# Patient Record
Sex: Female | Born: 1980 | Race: Black or African American | Hispanic: No | Marital: Married | Smoking: Never smoker
Health system: Southern US, Community
[De-identification: ages and names within clinical notes are randomized; demographics above are authoritative.]

## PROBLEM LIST (undated history)

## (undated) DIAGNOSIS — D6859 Other primary thrombophilia: Secondary | ICD-10-CM

## (undated) HISTORY — PX: TUBAL LIGATION: SHX77

---

## 2015-02-14 ENCOUNTER — Ambulatory Visit
Admission: EM | Admit: 2015-02-14 | Discharge: 2015-02-14 | Disposition: A | Discharge status: Home / Self Care | Payer: BC Managed Care – PPO | Attending: Family Medicine | Admitting: Family Medicine

## 2015-02-14 ENCOUNTER — Encounter: Payer: Self-pay | Admitting: *Deleted

## 2015-02-14 DIAGNOSIS — J018 Other acute sinusitis: Secondary | ICD-10-CM | POA: Diagnosis not present

## 2015-02-14 HISTORY — DX: Other primary thrombophilia: D68.59

## 2015-02-14 MED ORDER — FLUTICASONE PROPIONATE 50 MCG/ACT NA SUSP: 1.0000 | Freq: Every day | NASAL | Status: DC

## 2015-02-14 MED ORDER — AZITHROMYCIN 250 MG PO TABS: ORAL_TABLET | ORAL | Status: DC

## 2015-02-14 MED ORDER — LORATADINE 10 MG PO TABS: 10.0000 mg | ORAL_TABLET | Freq: Every day | ORAL | Status: DC

## 2015-02-14 NOTE — ED Notes (Signed)
Patient states that she has had nasal congestion, fever, generlized body aches, and vomiting at night starting this past Friday.  OTC medications have not resolved symptoms.

## 2015-02-14 NOTE — ED Provider Notes (Signed)
Patient presents today with symptoms of nasal congestion, sinus pressure, low-grade fever, myalgias and vomiting due to increased mucus over the last few days. Patient denies any fever today and has not taken any medication. She denies any cough, chest pain, shortness of breath, calf pain, severe headache, abdominal pain, diarrhea.  ROS: Negative except mentioned above.  Vitals as per Epic GENERAL: NAD HEENT: mild pharyngeal erythema, mild maxillary sinus tenderness, no exudate, no erythema of TMs, no cervical LAD RESP: CTA B CARD: RRR NEURO: CN II-XII grossly intact   A/P: Sinusitis- Z-pk, Claritin prn, Flonase prn, Tylenol prn, rest, hydration, seek medical attention if symptoms persist or worsen.   Jolene Provost, MD 02/14/15 [phone]

## 2016-05-07 ENCOUNTER — Encounter (HOSPITAL_COMMUNITY): Payer: Self-pay | Admitting: Emergency Medicine

## 2016-05-07 ENCOUNTER — Ambulatory Visit (HOSPITAL_COMMUNITY)
Admission: EM | Admit: 2016-05-07 | Discharge: 2016-05-07 | Disposition: A | Discharge status: Home / Self Care | Payer: BC Managed Care – PPO | Attending: Internal Medicine | Admitting: Internal Medicine

## 2016-05-07 DIAGNOSIS — J029 Acute pharyngitis, unspecified: Secondary | ICD-10-CM | POA: Diagnosis present

## 2016-05-07 DIAGNOSIS — R69 Illness, unspecified: Secondary | ICD-10-CM

## 2016-05-07 DIAGNOSIS — J111 Influenza due to unidentified influenza virus with other respiratory manifestations: Secondary | ICD-10-CM | POA: Insufficient documentation

## 2016-05-07 LAB — POCT RAPID STREP A: Streptococcus, Group A Screen (Direct): NEGATIVE

## 2016-05-07 MED ORDER — OSELTAMIVIR PHOSPHATE 75 MG PO CAPS: 75.0000 mg | ORAL_CAPSULE | Freq: Two times a day (BID) | ORAL | 0 refills | Status: DC

## 2016-05-07 MED ORDER — ALBUTEROL SULFATE HFA 108 (90 BASE) MCG/ACT IN AERS
2.0000 | INHALATION_SPRAY | RESPIRATORY_TRACT | 0 refills | Status: AC | PRN
Start: 2016-05-07 — End: ?

## 2016-05-07 MED ORDER — PREDNISONE 50 MG PO TABS: 50.0000 mg | ORAL_TABLET | Freq: Every day | ORAL | 0 refills | Status: DC

## 2016-05-07 NOTE — ED Provider Notes (Signed)
[facility]    CSN: [id number] Arrival date & time: 05/07/16  1801     History   Chief Complaint Chief Complaint  Patient presents with  . Cough  . Sore Throat    HPI Adriana Owen is a 36 y.o. female. Patient presents with sore throat and cough that started today. No fever that she knows of. Teaches first grade. Has had some students report headaches, and one with a GI concern recently. Thinks she had a flu shot last fall. Her students are in the middle of testing right now.   HPI  Past Medical History:  Diagnosis Date  . 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 CESAREAN SECTION     2x  . TUBAL LIGATION       Home Medications   Takes no meds regularly  Family History History reviewed. No pertinent family history.  Social History Social History  Substance Use Topics  . Smoking status: Never Smoker  . Smokeless tobacco: Never Used  . Alcohol use No     Allergies   Bee venom   Review of Systems Review of Systems  All other systems reviewed and are negative.    Physical Exam Triage Vital Signs ED Triage Vitals  Enc Vitals Group     BP 05/07/16 1814 128/72     Pulse Rate 05/07/16 1814 80     Resp 05/07/16 1814 18     Temp 05/07/16 1814 98.5 F (36.9 C)     Temp Source 05/07/16 1814 Oral     SpO2 05/07/16 1814 99 %     Weight --      Height --      Pain Score 05/07/16 1813 4     Pain Loc --    Updated Vital Signs BP 128/72 (BP Location: Right Arm)   Pulse 80   Temp 98.5 F (36.9 C) (Oral)   Resp 18   SpO2 99%   Physical Exam  Constitutional: She is oriented to person, place, and time. No distress.  HENT:  Head: Atraumatic.  Bilateral TMs are moderately dull, no erythema Mild nasal congestion bilaterally Throat is slightly injected, with prominent tonsils  Eyes:  Conjugate gaze observed, no eye redness/discharge  Neck: Neck supple.  Cardiovascular: Normal rate and  regular rhythm.   Pulmonary/Chest: No respiratory distress. She has no wheezes. She has no rales.  Slightly coarse but symmetric breath sounds throughout  Abdominal: She exhibits no distension.  Musculoskeletal: Normal range of motion.  Neurological: She is alert and oriented to person, place, and time.  Skin: Skin is warm and dry.  Nursing note and vitals reviewed.    UC Treatments / Results   Procedures Procedures (including critical care time) None today  Final Clinical Impressions(s) / UC Diagnoses   Final diagnoses:  Influenza-like illness   Note for work given, return to work 5/1.  New Prescriptions Discharge Medication List as of 05/07/2016  8:22 PM    START taking these medications   Details  albuterol (PROVENTIL HFA;VENTOLIN HFA) 108 (90 Base) MCG/ACT inhaler Inhale 2 puffs into the lungs every 4 (four) hours as needed for wheezing or shortness of breath., Starting Sun 05/07/2016, Normal    oseltamivir (TAMIFLU) 75 MG capsule Take 1 capsule (75 mg total) by mouth every 12 (twelve) hours., Starting Sun 05/07/2016, Normal    predniSONE (DELTASONE) 50 MG tablet Take 1 tablet (50 mg total) by mouth daily., Starting  Wynelle Link 05/07/2016, Normal         Eustace Moore, MD 05/10/16 0730

## 2016-05-07 NOTE — ED Triage Notes (Signed)
The patient presented to the [facility] with a complaint of a cough and sore throat that started today. The patient denied any fever at home.

## 2016-05-10 LAB — CULTURE, GROUP A STREP (THRC)

## 2016-11-14 ENCOUNTER — Other Ambulatory Visit: Payer: Self-pay | Admitting: Family Medicine

## 2017-08-05 ENCOUNTER — Emergency Department: Payer: BC Managed Care – PPO

## 2017-08-05 ENCOUNTER — Other Ambulatory Visit: Payer: Self-pay

## 2017-08-05 ENCOUNTER — Observation Stay
Admission: EM | Admit: 2017-08-05 | Discharge: 2017-08-06 | Disposition: A | Discharge status: Home / Self Care | Payer: BC Managed Care – PPO | Attending: Specialist | Admitting: Specialist

## 2017-08-05 DIAGNOSIS — R0789 Other chest pain: Secondary | ICD-10-CM | POA: Diagnosis present

## 2017-08-05 DIAGNOSIS — J90 Pleural effusion, not elsewhere classified: Secondary | ICD-10-CM | POA: Diagnosis not present

## 2017-08-05 DIAGNOSIS — D6859 Other primary thrombophilia: Secondary | ICD-10-CM | POA: Insufficient documentation

## 2017-08-05 DIAGNOSIS — R079 Chest pain, unspecified: Secondary | ICD-10-CM | POA: Diagnosis present

## 2017-08-05 DIAGNOSIS — Z79899 Other long term (current) drug therapy: Secondary | ICD-10-CM | POA: Insufficient documentation

## 2017-08-05 LAB — CBC
HEMATOCRIT: 43.6 % (ref 35.0–47.0)
HEMOGLOBIN: 14.9 g/dL (ref 12.0–16.0)
MCH: 31.3 pg (ref 26.0–34.0)
MCHC: 34.3 g/dL (ref 32.0–36.0)
MCV: 91.3 fL (ref 80.0–100.0)
Platelets: 264 10*3/uL (ref 150–440)
RBC: 4.78 MIL/uL (ref 3.80–5.20)
RDW: 13.9 % (ref 11.5–14.5)
WBC: 10.5 10*3/uL (ref 3.6–11.0)

## 2017-08-05 LAB — BASIC METABOLIC PANEL
Anion gap: 5 (ref 5–15)
BUN: 15 mg/dL (ref 6–20)
CALCIUM: 9 mg/dL (ref 8.9–10.3)
CHLORIDE: 108 mmol/L (ref 98–111)
CO2: 26 mmol/L (ref 22–32)
Creatinine, Ser: 0.9 mg/dL (ref 0.44–1.00)
GFR calc non Af Amer: >60 mL/min (ref >60)
Glucose, Bld: 108 mg/dL — ABNORMAL HIGH (ref 70–99)
POTASSIUM: 4.3 mmol/L (ref 3.5–5.1)
Sodium: 139 mmol/L (ref 135–145)

## 2017-08-05 LAB — TROPONIN I
Troponin I: <0.03 ng/mL (ref <0.03)
Troponin I: <0.03 ng/mL (ref <0.03)
Troponin I: <0.03 ng/mL (ref <0.03)
Troponin I: <0.03 ng/mL (ref <0.03)

## 2017-08-05 LAB — HEMOGLOBIN A1C
Hgb A1c MFr Bld: 5.5 % (ref 4.8–5.6)
Mean Plasma Glucose: 111.15 mg/dL

## 2017-08-05 LAB — TSH: TSH: 3.056 u[IU]/mL (ref 0.350–4.500)

## 2017-08-05 MED ORDER — MORPHINE SULFATE (PF) 2 MG/ML IV SOLN
2.0000 mg | Freq: Once | INTRAVENOUS | Status: AC
  Administered 2017-08-05: 2 mg via INTRAVENOUS

## 2017-08-05 MED ORDER — ASPIRIN EC 81 MG PO TBEC
81.0000 mg | DELAYED_RELEASE_TABLET | Freq: Every day | ORAL | Status: DC
  Administered 2017-08-05 – 2017-08-06 (×2): 81 mg via ORAL
  Filled 2017-08-05 (×2): qty 1

## 2017-08-05 MED ORDER — ONDANSETRON HCL 4 MG/2ML IJ SOLN: 4.0000 mg | Freq: Four times a day (QID) | INTRAMUSCULAR | Status: DC | PRN

## 2017-08-05 MED ORDER — TRAMADOL HCL 50 MG PO TABS: 50.0000 mg | ORAL_TABLET | Freq: Four times a day (QID) | ORAL | 0 refills | Status: AC | PRN

## 2017-08-05 MED ORDER — ACETAMINOPHEN 650 MG RE SUPP: 650.0000 mg | Freq: Four times a day (QID) | RECTAL | Status: DC | PRN

## 2017-08-05 MED ORDER — MORPHINE SULFATE (PF) 2 MG/ML IV SOLN
INTRAVENOUS | Status: AC
  Administered 2017-08-05: 2 mg via INTRAVENOUS
  Filled 2017-08-05: qty 1

## 2017-08-05 MED ORDER — ONDANSETRON HCL 4 MG/2ML IJ SOLN
4.0000 mg | Freq: Once | INTRAMUSCULAR | Status: AC
  Administered 2017-08-05: 4 mg via INTRAVENOUS

## 2017-08-05 MED ORDER — FONDAPARINUX SODIUM 2.5 MG/0.5ML ~~LOC~~ SOLN
2.5000 mg | Freq: Every day | SUBCUTANEOUS | Status: DC
  Administered 2017-08-05 – 2017-08-06 (×2): 2.5 mg via SUBCUTANEOUS
  Filled 2017-08-05 (×2): qty 0.5

## 2017-08-05 MED ORDER — DOCUSATE SODIUM 100 MG PO CAPS
100.0000 mg | ORAL_CAPSULE | Freq: Two times a day (BID) | ORAL | Status: DC
  Administered 2017-08-05 – 2017-08-06 (×2): 100 mg via ORAL
  Filled 2017-08-05 (×3): qty 1

## 2017-08-05 MED ORDER — ENOXAPARIN SODIUM 40 MG/0.4ML ~~LOC~~ SOLN: 40.0000 mg | SUBCUTANEOUS | Status: DC

## 2017-08-05 MED ORDER — SODIUM CHLORIDE 0.9 % IV BOLUS
1000.0000 mL | Freq: Once | INTRAVENOUS | Status: AC
  Administered 2017-08-05: 1000 mL via INTRAVENOUS

## 2017-08-05 MED ORDER — MORPHINE SULFATE (PF) 2 MG/ML IV SOLN
INTRAVENOUS | Status: AC
  Filled 2017-08-05: qty 1

## 2017-08-05 MED ORDER — MORPHINE SULFATE (PF) 4 MG/ML IV SOLN: 4.0000 mg | Freq: Once | INTRAVENOUS | Status: DC

## 2017-08-05 MED ORDER — ASPIRIN 81 MG PO CHEW
CHEWABLE_TABLET | ORAL | Status: AC
  Filled 2017-08-05: qty 4

## 2017-08-05 MED ORDER — KETOROLAC TROMETHAMINE 30 MG/ML IJ SOLN
30.0000 mg | Freq: Four times a day (QID) | INTRAMUSCULAR | Status: DC | PRN
  Administered 2017-08-05 – 2017-08-06 (×3): 30 mg via INTRAVENOUS
  Filled 2017-08-05 (×3): qty 1

## 2017-08-05 MED ORDER — ACETAMINOPHEN 325 MG PO TABS: 650.0000 mg | ORAL_TABLET | Freq: Four times a day (QID) | ORAL | Status: DC | PRN

## 2017-08-05 MED ORDER — ONDANSETRON HCL 4 MG/2ML IJ SOLN
INTRAMUSCULAR | Status: AC
  Administered 2017-08-05: 4 mg via INTRAVENOUS
  Filled 2017-08-05: qty 2

## 2017-08-05 MED ORDER — ASPIRIN 81 MG PO CHEW
324.0000 mg | CHEWABLE_TABLET | Freq: Once | ORAL | Status: AC
  Administered 2017-08-05: 324 mg via ORAL

## 2017-08-05 MED ORDER — IOPAMIDOL (ISOVUE-370) INJECTION 76%
75.0000 mL | Freq: Once | INTRAVENOUS | Status: AC | PRN
  Administered 2017-08-05: 75 mL via INTRAVENOUS

## 2017-08-05 MED ORDER — ONDANSETRON HCL 4 MG PO TABS: 4.0000 mg | ORAL_TABLET | Freq: Four times a day (QID) | ORAL | Status: DC | PRN

## 2017-08-05 NOTE — ED Notes (Signed)
Admitting MD at bedside.

## 2017-08-05 NOTE — ED Notes (Signed)
ED Provider at bedside. 

## 2017-08-05 NOTE — H&P (Signed)
Adriana Owen is an 37 y.o. female.   Chief Complaint: Chest pain HPI: The patient with past medical history of Antithrombin III deficiency presents to emergency department complaining of chest pain.  The pain began approximately 14 hours prior to admission.  It is behind her left breast and does not radiate.  The patient admits that the pain is positional and hurts worse when she is lying down.  She could also elicit the pain by breathing deeply which prompted the emergency department staff to obtain a CTA of the chest to rule out pulmonary embolus.  CT scan was negative.  The patient's troponin was negative and her EKG was negative for signs of ischemia.  However due to her coagulation disorder the emergency department staff requested for rule out of acute coronary syndrome.  Past Medical History:  Diagnosis Date  . 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 CESAREAN SECTION     2x  . TUBAL LIGATION      Family History  Problem Relation Age of Onset  . Antithrombin III deficiency Mother    Social History:  reports that she has never smoked. She has never used smokeless tobacco. She reports that she does not drink alcohol or use drugs.  Allergies:  Allergies  Allergen Reactions  . Bee Venom Swelling  . Pork-Derived Products Other (See Comments)    Religious Reasons     (Not in a hospital admission)  Results for orders placed or performed during the hospital encounter of 08/05/17 (from the past 48 hour(s))  Basic metabolic panel     Status: Abnormal   Collection Time: 08/05/17  4:07 AM  Result Value Ref Range   Sodium 139 135 - 145 mmol/L   Potassium 4.3 3.5 - 5.1 mmol/L   Chloride 108 98 - 111 mmol/L   CO2 26 22 - 32 mmol/L   Glucose, Bld 108 (H) 70 - 99 mg/dL   BUN 15 6 - 20 mg/dL   Creatinine, Ser 0.90 0.44 - 1.00 mg/dL   Calcium 9.0 8.9 - 10.3 mg/dL   GFR calc non Af Amer >60 >60 mL/min   GFR calc Af Amer >60 >60  mL/min    Comment: (NOTE) The eGFR has been calculated using the CKD EPI equation. This calculation has not been validated in all clinical situations. eGFR's persistently <60 mL/min signify possible Chronic Kidney Disease.    Anion gap 5 5 - 15    Comment: Performed at [facility], [address] [place], [place] [address]  CBC     Status: None   Collection Time: 08/05/17  4:07 AM  Result Value Ref Range   WBC 10.5 3.6 - 11.0 K/uL   RBC 4.78 3.80 - 5.20 MIL/uL   Hemoglobin 14.9 12.0 - 16.0 g/dL   HCT 43.6 35.0 - 47.0 %   MCV 91.3 80.0 - 100.0 fL   MCH 31.3 26.0 - 34.0 pg   MCHC 34.3 32.0 - 36.0 g/dL   RDW 13.9 11.5 - 14.5 %   Platelets 264 150 - 440 K/uL    Comment: Performed at [facility], [address] [place], [place] [address]  Troponin I     Status: None   Collection Time: 08/05/17  4:07 AM  Result Value Ref Range   Troponin I <0.03 <0.03 ng/mL    Comment: Performed at [facility], [address] [place], [place] [address]   Ct Angio  Chest Pe W And/or Wo Contrast  Result Date: 08/05/2017 CLINICAL DATA:  LEFT chest pain beginning at 1300 hours yesterday, gradually worsening. Shortness of breath. Pain with inspiration. History of anti thrombin 3 deficiency. EXAM: CT ANGIOGRAPHY CHEST WITH CONTRAST TECHNIQUE: Multidetector CT imaging of the chest was performed using the standard protocol during bolus administration of intravenous contrast. Multiplanar CT image reconstructions and MIPs were obtained to evaluate the vascular anatomy. CONTRAST:  38m ISOVUE-370 IOPAMIDOL (ISOVUE-370) INJECTION 76% COMPARISON:  Chest radiograph August 05, 2017 FINDINGS: CARDIOVASCULAR: Adequate contrast opacification of the pulmonary artery's. Main pulmonary artery is not enlarged. No pulmonary arterial filling defects to the level of the subsegmental branches. Heart size is normal, no right heart strain. No pericardial effusion. Thoracic aorta is normal course and  caliber, unremarkable. MEDIASTINUM/NODES: No lymphadenopathy by CT size criteria. LUNGS/PLEURA: Tracheobronchial tree is patent, no pneumothorax. Small LEFT pleural effusion with enhancing atelectasis. No focal consolidation. UPPER ABDOMEN: Included view of the abdomen is unremarkable. MUSCULOSKELETAL: Visualized soft tissues and included osseous structures appear normal. Review of the MIP images confirms the above findings. IMPRESSION: 1. No acute pulmonary embolism. 2. Small LEFT pleural effusion and LEFT lower lobe atelectasis. Electronically Signed   By: CElon AlasM.D.   On: 08/05/2017 05:17   Dg Chest Port 1 View  Result Date: 08/05/2017 CLINICAL DATA:  Worsening chest pain today.  Shortness of breath. EXAM: PORTABLE CHEST 1 VIEW COMPARISON:  None. FINDINGS: Cardiomediastinal silhouette is normal. Bronchitic changes with LEFT lung base strandy densities. No pleural effusions or focal consolidations. Trachea projects midline and there is no pneumothorax. Soft tissue planes and included osseous structures are non-suspicious. IMPRESSION: Bronchitic changes.  LEFT lung base atelectasis. Electronically Signed   By: CElon AlasM.D.   On: 08/05/2017 04:14    Review of Systems  Constitutional: Negative for chills and fever.  HENT: Negative for sore throat and tinnitus.   Eyes: Negative for blurred vision and redness.  Respiratory: Negative for cough and shortness of breath.   Cardiovascular: Positive for chest pain. Negative for palpitations, orthopnea and PND.  Gastrointestinal: Negative for abdominal pain, diarrhea, nausea and vomiting.  Genitourinary: Negative for dysuria, frequency and urgency.  Musculoskeletal: Negative for joint pain and myalgias.  Skin: Negative for rash.       No lesions  Neurological: Negative for speech change, focal weakness and weakness.  Endo/Heme/Allergies: Does not bruise/bleed easily.       No temperature intolerance  Psychiatric/Behavioral: Negative  for depression and suicidal ideas.    Blood pressure 113/73, pulse (!) 50, resp. rate 11, height 5' 5.5" (1.664 m), weight 77.1 kg (170 lb), last menstrual period 07/17/2017, SpO2 100 %. Physical Exam  Vitals reviewed. Constitutional: She is oriented to person, place, and time. She appears well-developed and well-nourished. No distress.  HENT:  Head: Normocephalic and atraumatic.  Mouth/Throat: Oropharynx is clear and moist.  Eyes: Pupils are equal, round, and reactive to light. Conjunctivae and EOM are normal. No scleral icterus.  Neck: Normal range of motion. Neck supple. No JVD present. No tracheal deviation present. No thyromegaly present.  Cardiovascular: Normal rate, regular rhythm and normal heart sounds. Exam reveals no gallop and no friction rub.  No murmur heard. Respiratory: Effort normal and breath sounds normal.  GI: Soft. Bowel sounds are normal. She exhibits no distension and no mass. There is tenderness. There is no rebound and no guarding.  Genitourinary:  Genitourinary Comments: Deferred  Musculoskeletal: Normal range of motion. She exhibits no edema.  Lymphadenopathy:    She has no cervical adenopathy.  Neurological: She is alert and oriented to person, place, and time. No cranial nerve deficit. She exhibits normal muscle tone.  Skin: Skin is warm and dry. No rash noted. No erythema.  Psychiatric: She has a normal mood and affect. Her behavior is normal. Judgment and thought content normal.     Assessment/Plan This is a 37 year old female admitted for chest pain.  1.  Chest pain: Atypical; reproducible with palpation of the patient's left upper quadrant.  Further examination of her chest x-ray shows significant gas in the splenic flexure.  Etiology of the patient's atypical chest pain is likely intestinal gas.  Nonetheless, consult cardiology as the patient does not take aspirin every day as she should.  Continue to monitor telemetry and follow cardiac biomarkers. 2.   Antithrombin III deficiency: I restarted aspirin. 3.  DVT prophylaxis: [place] 4.  GI prophylaxis: None The patient is a full code.  Time spent on admission orders and patient care proximally 45 minutes  Harrie Foreman, MD 08/05/2017, 6:30 AM

## 2017-08-05 NOTE — Progress Notes (Signed)
Cardiology Consultation:   Patient [facility]; [id number]; 1980/08/19   Admit date: 08/05/2017 Date of Consult: 08/05/2017  Primary Care Provider: [facility] Primary Cardiologist: No primary care provider on file. (New)  Patient Profile:   Adriana Owen is a 37 y.o. female with a hx of anti-thrombin 3 deficieincy who is being seen today for the evaluation of chest pain at the request of Dr. Cherlynn Owen.  History of Present Illness:   [name] is a 37 yo female with history of anti-thrombin 3 deficiency who is admitted with chest pain. No prior cardiac history. The pain is in under her left breast and does not radiate. No associated dyspnea, nausea, vomiting or diaphoresis. EKG without ischemic changes. Troponin negative. The pain worsens with movement and deep breaths.   Past Medical History:  Diagnosis Date  . 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 CESAREAN SECTION     2x  . TUBAL LIGATION       Home Medications:  Prior to Admission medications   Medication Sig Start Date End Date Taking? Authorizing Provider  albuterol (PROVENTIL HFA;VENTOLIN HFA) 108 (90 Base) MCG/ACT inhaler Inhale 2 puffs into the lungs every 4 (four) hours as needed for wheezing or shortness of breath. 05/07/16   Adriana Rankin, MD  EPINEPHrine 0.3 mg/0.3 mL IJ SOAJ injection Inject 0.3 mg into the muscle once as needed for anaphylaxis.    [provider]    Inpatient Medications: Scheduled Meds: . aspirin      . aspirin EC  81 mg Oral Daily  . docusate sodium  100 mg Oral BID  . fondaparinux (ARIXTRA) injection  2.5 mg Subcutaneous Daily  .  morphine injection  4 mg Intravenous Once   Continuous Infusions:  PRN Meds: acetaminophen **OR** acetaminophen, ketorolac, ondansetron **OR** ondansetron (ZOFRAN) IV  Allergies:    Allergies  Allergen Reactions  . Bee Venom Swelling  . Pork-Derived  Products Other (See Comments)    Religious Reasons    Social History:   Social History   Socioeconomic History  . Marital status: Married    Spouse name: Not on file  . Number of children: Not on file  . Years of education: Not on file  . Highest education level: Not on file  Occupational History  . Not on file  Social Needs  . Financial resource strain: Not on file  . Food insecurity:    Worry: Not on file    Inability: Not on file  . Transportation needs:    Medical: Not on file    Non-medical: Not on file  Tobacco Use  . Smoking status: Never Smoker  . Smokeless tobacco: Never Used  Substance and Sexual Activity  . Alcohol use: No  . Drug use: No  . Sexual activity: Not on file  Lifestyle  . Physical activity:    Days per week: Not on file    Minutes per session: Not on file  . Stress: Not on file  Relationships  . Social connections:    Talks on phone: Not on file    Gets together: Not on file    Attends religious service: Not on file    Active member of club or organization: Not on file    Attends meetings of clubs or organizations: Not on file    Relationship status: Not on file  . Intimate partner violence:    Fear of current or ex  partner: Not on file    Emotionally abused: Not on file    Physically abused: Not on file    Forced sexual activity: Not on file  Other Topics Concern  . Not on file  Social History Narrative  . Not on file    Family History:   Family History  Problem Relation Age of Onset  . Antithrombin III deficiency Mother      ROS:  Please see the history of present illness.  All other ROS reviewed and negative.     Physical Exam/Data:   Vitals:   08/05/17 0700 08/05/17 0730 08/05/17 0800 08/05/17 0822  BP: 110/71 110/64 112/63 105/68  Pulse: 68 (!) 59 70 62  Resp: 11 (!) 25 19   Temp:    97.9 F (36.6 C)  TempSrc:    Oral  SpO2: 97% 97% 97% 100%  Weight:      Height:        Intake/Output Summary (Last 24 hours) at  08/05/2017 1222 Last data filed at 08/05/2017 1035 Gross per 24 hour  Intake 1120 ml  Output -  Net 1120 ml   Filed Weights   08/05/17 0348  Weight: 170 lb (77.1 kg)   Body mass index is 27.86 kg/m.  General:  Well nourished, well developed, in no acute distress HEENT: normal Lymph: no adenopathy Neck: no JVD Endocrine:  No thryomegaly Vascular: No carotid bruits; FA pulses 2+ bilaterally without bruits  Cardiac:  normal S1, S2; RRR; no murmur  Lungs:  clear to auscultation bilaterally, no wheezing, rhonchi or rales  Abd: soft, nontender, no hepatomegaly  Ext: no edema Musculoskeletal:  No deformities, BUE and BLE strength normal and equal Skin: warm and dry  Neuro:  CNs 2-12 intact, no focal abnormalities noted Psych:  Normal affect   EKG:  The EKG was personally reviewed and demonstrates:  Sinus, repolarization abnormality.  Telemetry:  Telemetry was personally reviewed and demonstrates:  sinus  Relevant CV Studies:   Laboratory Data:  Chemistry Recent Labs  Lab 08/05/17 0407  NA 139  K 4.3  CL 108  CO2 26  GLUCOSE 108*  BUN 15  CREATININE 0.90  CALCIUM 9.0  GFRNONAA >60  GFRAA >60  ANIONGAP 5    No results for input(s): PROT, ALBUMIN, AST, ALT, ALKPHOS, BILITOT in the last 168 hours. Hematology Recent Labs  Lab 08/05/17 0407  WBC 10.5  RBC 4.78  HGB 14.9  HCT 43.6  MCV 91.3  MCH 31.3  MCHC 34.3  RDW 13.9  PLT 264   Cardiac Enzymes Recent Labs  Lab 08/05/17 0407  TROPONINI <0.03   No results for input(s): TROPIPOC in the last 168 hours.  BNPNo results for input(s): BNP, PROBNP in the last 168 hours.  DDimer No results for input(s): DDIMER in the last 168 hours.  Radiology/Studies:  Ct Angio Chest Pe W And/or Wo Contrast  Result Date: 08/05/2017 CLINICAL DATA:  LEFT chest pain beginning at 1300 hours yesterday, gradually worsening. Shortness of breath. Pain with inspiration. History of anti thrombin 3 deficiency. EXAM: CT ANGIOGRAPHY CHEST  WITH CONTRAST TECHNIQUE: Multidetector CT imaging of the chest was performed using the standard protocol during bolus administration of intravenous contrast. Multiplanar CT image reconstructions and MIPs were obtained to evaluate the vascular anatomy. CONTRAST:  75mL ISOVUE-370 IOPAMIDOL (ISOVUE-370) INJECTION 76% COMPARISON:  Chest radiograph August 05, 2017 FINDINGS: CARDIOVASCULAR: Adequate contrast opacification of the pulmonary artery's. Main pulmonary artery is not enlarged. No pulmonary arterial filling defects to  the level of the subsegmental branches. Heart size is normal, no right heart strain. No pericardial effusion. Thoracic aorta is normal course and caliber, unremarkable. MEDIASTINUM/NODES: No lymphadenopathy by CT size criteria. LUNGS/PLEURA: Tracheobronchial tree is patent, no pneumothorax. Small LEFT pleural effusion with enhancing atelectasis. No focal consolidation. UPPER ABDOMEN: Included view of the abdomen is unremarkable. MUSCULOSKELETAL: Visualized soft tissues and included osseous structures appear normal. Review of the MIP images confirms the above findings. IMPRESSION: 1. No acute pulmonary embolism. 2. Small LEFT pleural effusion and LEFT lower lobe atelectasis. Electronically Signed   By: Awilda Metro M.D.   On: 08/05/2017 05:17   Dg Chest Port 1 View  Result Date: 08/05/2017 CLINICAL DATA:  Worsening chest pain today.  Shortness of breath. EXAM: PORTABLE CHEST 1 VIEW COMPARISON:  None. FINDINGS: Cardiomediastinal silhouette is normal. Bronchitic changes with LEFT lung base strandy densities. No pleural effusions or focal consolidations. Trachea projects midline and there is no pneumothorax. Soft tissue planes and included osseous structures are non-suspicious. IMPRESSION: Bronchitic changes.  LEFT lung base atelectasis. Electronically Signed   By: Awilda Metro M.D.   On: 08/05/2017 04:14    Assessment and Plan:   1. Chest pain: her pain is atypical. First troponin  negative. EKG without ischemic changes. Her pain does not appear to be cardiac related. She has no PE on CTA but there is small left pleural effusion. There is no pericardial effusion seen on the chest CT.Her pain is most likely from the pleural effusion. I would check one more troponin. If negative, then no further cardiac workup.     For questions or updates, please contact [facility] Please consult [email or web address] for contact info under Cardiology/STEMI.   Signed, Verne Carrow, MD  08/05/2017 12:22 PM

## 2017-08-05 NOTE — ED Notes (Signed)
RN to bedside for rounding. Patient in CT department

## 2017-08-05 NOTE — ED Notes (Signed)
Gerilyn PilgrimJacob, EDT, to transport pt to 2A-[id number]. Floor aware patient is en route.

## 2017-08-05 NOTE — ED Notes (Signed)
Attempted to call report x 1  

## 2017-08-05 NOTE — ED Triage Notes (Signed)
Reports left sided chest pain since 1 pm that has gotten worse.

## 2017-08-05 NOTE — ED Provider Notes (Signed)
[facility] Emergency Department Provider Note    First MD Initiated Contact with Patient 08/05/17 [phone]     (approximate)  I have reviewed the triage vital signs and the nursing notes.   HISTORY  Chief Complaint Chest Pain   HPI Adriana Owen is a 37 y.o. female with history of Antithrombin III deficiency presents to the emergency department acute onset of left sided chest pain which began at 1 PM yesterday afternoon is progressively worsened since onset.  Patient presents to the emergency department at this time with 10 out of 10 left-sided chest pain, dyspnea.  Pain is worse with deep inspiration.  Patient denies any nausea vomiting or abdominal pain.  Patient denies any dizziness or syncope.    Past Medical History:  Diagnosis Date  . Antithrombin 3 deficiency (HCC)     There are no active problems to display for this patient.   Past Surgical History:  Procedure Laterality Date  . CESAREAN SECTION     2x  . TUBAL LIGATION      Prior to Admission medications   Medication Sig Start Date End Date Taking? Authorizing Provider  albuterol (PROVENTIL HFA;VENTOLIN HFA) 108 (90 Base) MCG/ACT inhaler Inhale 2 puffs into the lungs every 4 (four) hours as needed for wheezing or shortness of breath. 05/07/16   Isa RankinMurray, Laura Wilson, MD  aspirin EC 81 MG tablet Take 81 mg by mouth daily.    [provider]  EPINEPHrine 0.3 mg/0.3 mL IJ SOAJ injection Inject 0.3 mg into the muscle once as needed for anaphylaxis.    [provider]  oseltamivir (TAMIFLU) 75 MG capsule Take 1 capsule (75 mg total) by mouth every 12 (twelve) hours. Patient not taking: Reported on 08/05/2017 05/07/16   Isa RankinMurray, Laura Wilson, MD  predniSONE (DELTASONE) 50 MG tablet Take 1 tablet (50 mg total) by mouth daily. Patient not taking: Reported on 08/05/2017 05/07/16   Isa RankinMurray, Laura Wilson, MD    Allergies Bee venom and Pork-derived products  No family history on  file.  Social History Social History   Tobacco Use  . Smoking status: Never Smoker  . Smokeless tobacco: Never Used  Substance Use Topics  . Alcohol use: No  . Drug use: No    Review of Systems Constitutional: No fever/chills Eyes: No visual changes. ENT: No sore throat. Cardiovascular: Positive for chest pain. Respiratory: Positive for shortness of breath. Gastrointestinal: No abdominal pain.  No nausea, no vomiting.  No diarrhea.  No constipation. Genitourinary: Negative for dysuria. Musculoskeletal: Negative for neck pain.  Negative for back pain. Integumentary: Negative for rash. Neurological: Negative for headaches, focal weakness or numbness.   ____________________________________________   PHYSICAL EXAM:  VITAL SIGNS: ED Triage Vitals  Enc Vitals Group     BP 08/05/17 0400 (!) 129/94     Pulse Rate 08/05/17 0400 95     Resp 08/05/17 0400 (!) 32     Temp --      Temp src --      SpO2 08/05/17 0400 97 %     Weight 08/05/17 0348 77.1 kg (170 lb)     Height 08/05/17 0348 1.664 m (5' 5.5")     Head Circumference --      Peak Flow --      Pain Score 08/05/17 0348 10     Pain Loc --      Pain Edu? --      Excl. in GC? --     Constitutional: Alert  and oriented.  Apparent discomfort  eyes: Conjunctivae are normal.  Head: Atraumatic.Marland Kitchen Mouth/Throat: Mucous membranes are moist.  Oropharynx non-erythematous. Neck: No stridor.   Cardiovascular: Normal rate, regular rhythm. Good peripheral circulation. Grossly normal heart sounds. Respiratory: Normal respiratory effort.  No retractions. Lungs CTAB. Gastrointestinal: Soft and nontender. No distention.  Musculoskeletal: No lower extremity tenderness nor edema. No gross deformities of extremities. Neurologic:  Normal speech and language. No gross focal neurologic deficits are appreciated.  Skin:  Skin is warm, dry and intact. No rash noted. Psychiatric: Mood and affect are normal. Speech and behavior are  normal.  ____________________________________________   LABS (all labs ordered are listed, but only abnormal results are displayed)  Labs Reviewed  BASIC METABOLIC PANEL - Abnormal; Notable for the following components:      Result Value   Glucose, Bld 108 (*)    All other components within normal limits  CBC  TROPONIN I   ____________________________________________  EKG  ED ECG REPORT I, [place] Lakashia Collison, the attending physician, personally viewed and interpreted this ECG.   Date: 08/05/2017  EKG Time: 3:52 AM  Rate: 88  Rhythm: Normal sinus rhythm  Axis: Normal  Intervals: Normal  ST&T Change: None  ____________________________________________  RADIOLOGY I, [place] Cynitha Berte, personally viewed and evaluated these images (plain radiographs) as part of my medical decision making, as well as reviewing the written report by the radiologist.  ED MD interpretation: No pulmonary emboli noted per radiologist.  Small left pleural effusion.  Official radiology report(s): Ct Angio Chest Pe W And/or Wo Contrast  Result Date: 08/05/2017 CLINICAL DATA:  LEFT chest pain beginning at 1300 hours yesterday, gradually worsening. Shortness of breath. Pain with inspiration. History of anti thrombin 3 deficiency. EXAM: CT ANGIOGRAPHY CHEST WITH CONTRAST TECHNIQUE: Multidetector CT imaging of the chest was performed using the standard protocol during bolus administration of intravenous contrast. Multiplanar CT image reconstructions and MIPs were obtained to evaluate the vascular anatomy. CONTRAST:  75mL ISOVUE-370 IOPAMIDOL (ISOVUE-370) INJECTION 76% COMPARISON:  Chest radiograph August 05, 2017 FINDINGS: CARDIOVASCULAR: Adequate contrast opacification of the pulmonary artery's. Main pulmonary artery is not enlarged. No pulmonary arterial filling defects to the level of the subsegmental branches. Heart size is normal, no right heart strain. No pericardial effusion. Thoracic aorta is normal course  and caliber, unremarkable. MEDIASTINUM/NODES: No lymphadenopathy by CT size criteria. LUNGS/PLEURA: Tracheobronchial tree is patent, no pneumothorax. Small LEFT pleural effusion with enhancing atelectasis. No focal consolidation. UPPER ABDOMEN: Included view of the abdomen is unremarkable. MUSCULOSKELETAL: Visualized soft tissues and included osseous structures appear normal. Review of the MIP images confirms the above findings. IMPRESSION: 1. No acute pulmonary embolism. 2. Small LEFT pleural effusion and LEFT lower lobe atelectasis. Electronically Signed   By: Awilda Metro M.D.   On: 08/05/2017 05:17   Dg Chest Port 1 View  Result Date: 08/05/2017 CLINICAL DATA:  Worsening chest pain today.  Shortness of breath. EXAM: PORTABLE CHEST 1 VIEW COMPARISON:  None. FINDINGS: Cardiomediastinal silhouette is normal. Bronchitic changes with LEFT lung base strandy densities. No pleural effusions or focal consolidations. Trachea projects midline and there is no pneumothorax. Soft tissue planes and included osseous structures are non-suspicious. IMPRESSION: Bronchitic changes.  LEFT lung base atelectasis. Electronically Signed   By: Awilda Metro M.D.   On: 08/05/2017 04:14     Procedures   ____________________________________________   INITIAL IMPRESSION / ASSESSMENT AND PLAN / ED COURSE  As part of my medical decision making, I reviewed the following data  within the electronic MEDICAL RECORD NUMBER   77-year-old female presented with above-stated history and physical exam secondary to chest pain.  Concern for possible pulmonary emboli and as such CT scan of the chest was performed which revealed no evidence of PE per radiologist.  Concern for possible cardiac etiology as such EKG was performed which revealed no evidence of ischemia or infarction.  Troponin negative x1.  Patient given IV morphine and on reevaluation patient states that her pain is still 8 out of 10.  As such patient was discussed with Dr.  Sheryle Hail for hospital admission further evaluation and management for chest pain ____________________________________________  FINAL CLINICAL IMPRESSION(S) / ED DIAGNOSES  Final diagnoses:  Chest pain, unspecified type     MEDICATIONS GIVEN DURING THIS VISIT:  Medications  morphine 4 MG/ML injection 4 mg (4 mg Intravenous Not Given 08/05/17 0529)  sodium chloride 0.9 % bolus 1,000 mL (1,000 mLs Intravenous New Bag/Given 08/05/17 0544)  morphine 2 MG/ML injection 2 mg (2 mg Intravenous Given 08/05/17 0408)  ondansetron (ZOFRAN) injection 4 mg (4 mg Intravenous Given 08/05/17 0408)  iopamidol (ISOVUE-370) 76 % injection 75 mL (75 mLs Intravenous Contrast Given 08/05/17 0459)  morphine 2 MG/ML injection 2 mg (2 mg Intravenous Given 08/05/17 0529)     ED Discharge Orders    None       Note:  This document was prepared using Dragon voice recognition software and may include unintentional dictation errors.    Darci Current, MD 08/05/17 [phone]

## 2017-08-05 NOTE — Plan of Care (Signed)
  Problem: Clinical Measurements: Goal: Respiratory complications will improve Outcome: Not Progressing Note:  Patient still c/o L-sided chest pain upon deep inspiration. Physician aware. Toradol IV given x 1 for pain. Will continue to monitor pain level. Jari FavreSteven M [facility] Cent[facility]hoff

## 2017-08-05 NOTE — ED Notes (Signed)
Patient's family at bedside. Extra chair and blankets provided.

## 2017-08-05 NOTE — ED Notes (Signed)
X-ray at bedside

## 2017-08-05 NOTE — ED Notes (Signed)
Patient c/o left chest pain beginning at 1300 today, gradually increasing throughout the day. Patient reports accompanying symptom of SOB. Patient reports pain worsens with inspiration and laying flat.

## 2017-08-06 ENCOUNTER — Observation Stay: Payer: BC Managed Care – PPO

## 2017-08-06 LAB — CBC
HCT: 39 % (ref 35.0–47.0)
Hemoglobin: 13.4 g/dL (ref 12.0–16.0)
MCH: 31.3 pg (ref 26.0–34.0)
MCHC: 34.4 g/dL (ref 32.0–36.0)
MCV: 91.1 fL (ref 80.0–100.0)
Platelets: 246 10*3/uL (ref 150–440)
RBC: 4.28 MIL/uL (ref 3.80–5.20)
RDW: 14.1 % (ref 11.5–14.5)
WBC: 8.5 10*3/uL (ref 3.6–11.0)

## 2017-08-06 MED ORDER — LEVOFLOXACIN 500 MG PO TABS: 500.0000 mg | ORAL_TABLET | Freq: Every day | ORAL | 0 refills | Status: AC

## 2017-08-06 NOTE — Progress Notes (Signed)
Pt with discharge orders, pt given incentive spirometer and educated on benefits and use, she has ambulated around nursing unit/station with minimal pain, pt. States her ride will not be here until after lunch for discharge.

## 2017-08-07 NOTE — Discharge Summary (Signed)
[employer] [facility] [place] at [facility]   PATIENT NAME: Adriana Owen    MR#:  [id number]  DATE OF BIRTH:  Oct 12, 1980  DATE OF ADMISSION:  08/05/2017 ADMITTING PHYSICIAN: Arnaldo Natal, MD  DATE OF DISCHARGE: 08/06/2017  1:09 PM  PRIMARY CARE PHYSICIAN: [place], [facility]    ADMISSION DIAGNOSIS:  Chest pain, unspecified type [R07.9]  DISCHARGE DIAGNOSIS:  Active Problems:   Atypical chest pain   SECONDARY DIAGNOSIS:   Past Medical History:  Diagnosis Date  . Antithrombin 3 deficiency [facility])     HOSPITAL COURSE:   37 year old female with past medical history of Antithrombin III deficiency who presented to the hospital due to atypical chest pain.  1.  Chest pain- patient symptoms are very atypical for angina.  But given her Antithrombin III deficiency she was observed overnight in the hospital.  She had 3 sets of cardiac markers checked which were negative.  A cardiology consult was obtained who did not recommend any further acute intervention.  Her EKG showed no acute ST or T wave changes. - Patient CT chest was negative for pulmonary embolism did show a small pleural effusion with atelectasis.  On the day of discharge patient had a repeat chest x-ray which also showed atelectasis which is secondary to patient's splinting from her chest pain.  Patient was given an incentive spirometer, given 5 days of empiric Levaquin and discharged on pain control with oral tramadol and follow-up with her primary care physician.  2.  History of Antithrombin III deficiency-patient has never had a DVT or PE in the past.  Her CT chest on this hospital also negative.  Further management this as per her primary care physician as an outpatient.  DISCHARGE CONDITIONS:   Stable.   CONSULTS OBTAINED:  Treatment Team:  Iran Ouch, MD  DRUG ALLERGIES:   Allergies  Allergen Reactions  . Bee Venom Swelling  . Pork-Derived Products Other (See Comments)     Religious Reasons    DISCHARGE MEDICATIONS:   Allergies as of 08/06/2017      Reactions   Bee Venom Swelling   Pork-derived Products Other (See Comments)   Religious Reasons      Medication List    TAKE these medications   albuterol 108 (90 Base) MCG/ACT inhaler Commonly known as:  PROVENTIL HFA;VENTOLIN HFA Inhale 2 puffs into the lungs every 4 (four) hours as needed for wheezing or shortness of breath.   EPINEPHrine 0.3 mg/0.3 mL Soaj injection Commonly known as:  EPI-PEN Inject 0.3 mg into the muscle once as needed for anaphylaxis.   levofloxacin 500 MG tablet Commonly known as:  LEVAQUIN Take 1 tablet (500 mg total) by mouth daily for 5 days. Notes to patient:  This is your antibiotic. Begin taking it when you have picked up the prescription.    traMADol 50 MG tablet Commonly known as:  ULTRAM Take 1 tablet (50 mg total) by mouth every 6 (six) hours as needed.         DISCHARGE INSTRUCTIONS:   DIET:  Regular diet  DISCHARGE CONDITION:  Stable  ACTIVITY:  Activity as tolerated  OXYGEN:  Home Oxygen: No.   Oxygen Delivery: room air  DISCHARGE LOCATION:  home   If you experience worsening of your admission symptoms, develop shortness of breath, life threatening emergency, suicidal or homicidal thoughts you must seek medical attention immediately by calling 911 or calling your MD immediately  if symptoms less severe.  You Must read complete  instructions/literature along with all the possible adverse reactions/side effects for all the Medicines you take and that have been prescribed to you. Take any new Medicines after you have completely understood and accpet all the possible adverse reactions/side effects.   Please note  You were cared for by a hospitalist during your hospital stay. If you have any questions about your discharge medications or the care you received while you were in the hospital after you are discharged, you can call the unit and asked to  speak with the hospitalist on call if the hospitalist that took care of you is not available. Once you are discharged, your primary care physician will handle any further medical issues. Please note that NO REFILLS for any discharge medications will be authorized once you are discharged, as it is imperative that you return to your primary care physician (or establish a relationship with a primary care physician if you do not have one) for your aftercare needs so that they can reassess your need for medications and monitor your lab values.     Today   Still having some mild pleuritic chest pain, but not hypoxic, no nausea vomiting or any other associated symptoms.  VITAL SIGNS:  Blood pressure 111/71, pulse 79, temperature 98.6 F (37 C), temperature source Oral, resp. rate 18, height 5' 5.5" (1.664 m), weight 80 kg (176 lb 4.8 oz), last menstrual period 07/17/2017, SpO2 96 %.  I/O:  No intake or output data in the 24 hours ending 08/07/17 1531  PHYSICAL EXAMINATION:  GENERAL:  37 y.o.-year-old patient lying in the bed with no acute distress.  EYES: Pupils equal, round, reactive to light and accommodation. No scleral icterus. Extraocular muscles intact.  HEENT: Head atraumatic, normocephalic. Oropharynx and nasopharynx clear.  NECK:  Supple, no jugular venous distention. No thyroid enlargement, no tenderness.  LUNGS: Poor Resp. Effort due to splinting, no wheezing, rales,rhonchi. No use of accessory muscles of respiration.  CARDIOVASCULAR: S1, S2 normal. No murmurs, rubs, or gallops.  ABDOMEN: Soft, non-tender, non-distended. Bowel sounds present. No organomegaly or mass.  EXTREMITIES: No pedal edema, cyanosis, or clubbing.  NEUROLOGIC: Cranial nerves II through XII are intact. No focal motor or sensory defecits b/l.  PSYCHIATRIC: The patient is alert and oriented x 3.   SKIN: No obvious rash, lesion, or ulcer.   DATA REVIEW:   CBC Recent Labs  Lab 08/06/17 1015  WBC 8.5  HGB 13.4   HCT 39.0  PLT 246    Chemistries  Recent Labs  Lab 08/05/17 0407  NA 139  K 4.3  CL 108  CO2 26  GLUCOSE 108*  BUN 15  CREATININE 0.90  CALCIUM 9.0    Cardiac Enzymes Recent Labs  Lab 08/05/17 2054  TROPONINI <0.03    Microbiology Results  Results for orders placed or performed during the hospital encounter of 05/07/16  Culture, group A strep     Status: None   Collection Time: 05/07/16  7:46 PM  Result Value Ref Range Status   Specimen Description THROAT  Final   Special Requests NONE  Final   Culture NO GROUP A STREP (S.PYOGENES) ISOLATED  Final   Report Status 05/10/2016 FINAL  Final    RADIOLOGY:  Dg Chest 2 View  Result Date: 08/06/2017 CLINICAL DATA:  Follow up Ms. Owen is a 37 yo female with history of anti-thrombin 3 deficiency who is admitted with chest pain. No prior cardiac history. The pain is in under her left breast and does not  radiate. EXAM: CHEST - 2 VIEW COMPARISON:  08/05/2017, chest x-ray and chest CT FINDINGS: Heart size is normal. There is patchy density at the LEFT lung base the increased compared to prior studies, consistent with effusion and atelectasis. Early infiltrate is not excluded. New interval patchy density at the RIGHT lung base. Findings are consistent atelectasis or early consolidation. No evidence for pulmonary edema. IMPRESSION: Increased bilateral LOWER lobe atelectasis or infiltrates. LEFT pleural effusion Electronically Signed   By: Norva PavlovElizabeth  Brown M.D.   On: 08/06/2017 09:18      Management plans discussed with the patient, family and they are in agreement.  CODE STATUS:  Code Status History    Date Active Date Inactive Code Status Order ID Comments User Context   08/05/2017 0826 08/06/2017 1610 Full Code [id number]  Arnaldo Nataliamond, Michael S, MD Inpatient    Advance Directive Documentation     Most Recent Value  Type of Advance Directive  Healthcare Power of Attorney  Pre-existing out of facility DNR order (yellow form  or pink MOST form)  -  "MOST" Form in Place?  -      TOTAL TIME TAKING CARE OF THIS PATIENT: 40 minutes.    Houston SirenSAINANI,Madisynn Plair J M.D on 08/07/2017 at 3:31 PM  Between 7am to 6pm - Pager - [phone]  After 6pm go to [email or web address] - Scientist, research (life [email or web address])password [email or web address]  [facility]  Office  [phone]  CC: Primary care physician; [place], [facility]

## 2018-07-16 ENCOUNTER — Other Ambulatory Visit: Payer: Self-pay | Admitting: *Deleted

## 2018-07-16 DIAGNOSIS — Z20822 Contact with and (suspected) exposure to covid-19: Secondary | ICD-10-CM

## 2018-07-22 LAB — NOVEL CORONAVIRUS, NAA: SARS-CoV-2, NAA: NOT DETECTED

## 2018-10-31 IMAGING — DX DG CHEST 1V PORT
1 series · 1 of 1 positions shown · non-contrast
Comparison: None.

CLINICAL DATA: Worsening chest pain today.  Shortness of breath.

EXAM:
PORTABLE CHEST 1 VIEW

[chest ap]
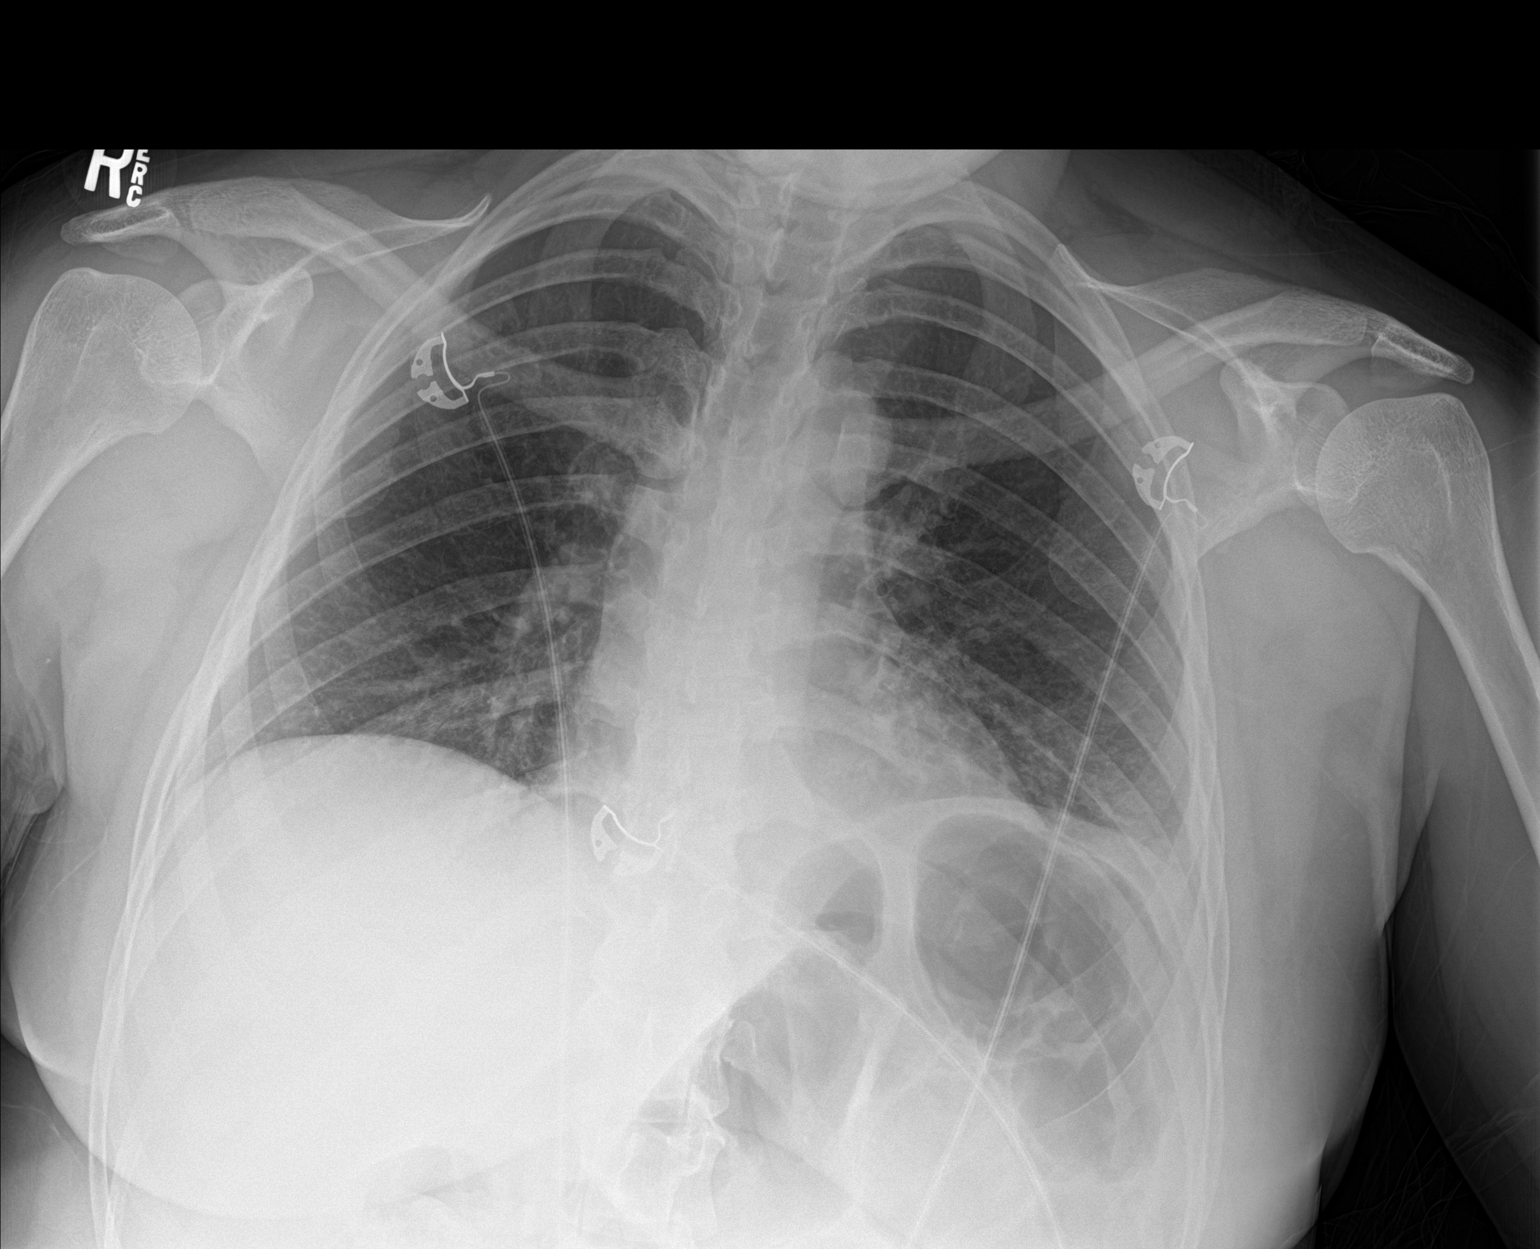

[1 of 1 positions shown; findings below may reference images not displayed]

FINDINGS: Cardiomediastinal silhouette is normal. Bronchitic changes with LEFT
lung base strandy densities. No pleural effusions or focal
consolidations. Trachea projects midline and there is no
pneumothorax. Soft tissue planes and included osseous structures are
non-suspicious.
IMPRESSION: Bronchitic changes.  LEFT lung base atelectasis.

## 2018-11-01 IMAGING — CR DG CHEST 2V
1 series · 2 of 2 positions shown · non-contrast
Comparison: 08/05/2017, chest x-ray and chest CT

CLINICAL DATA: Follow up Ms. Nati is a 36 yo female with
history of anti-thrombin 3 deficiency who is admitted with chest
pain. No prior cardiac history. The pain is in under her left breast
and does not radiate.

EXAM:
CHEST - 2 VIEW

[Series 1: dg chest 2 view · 0.14mm/px · 2 of 2 slices shown]
[im 1/2]
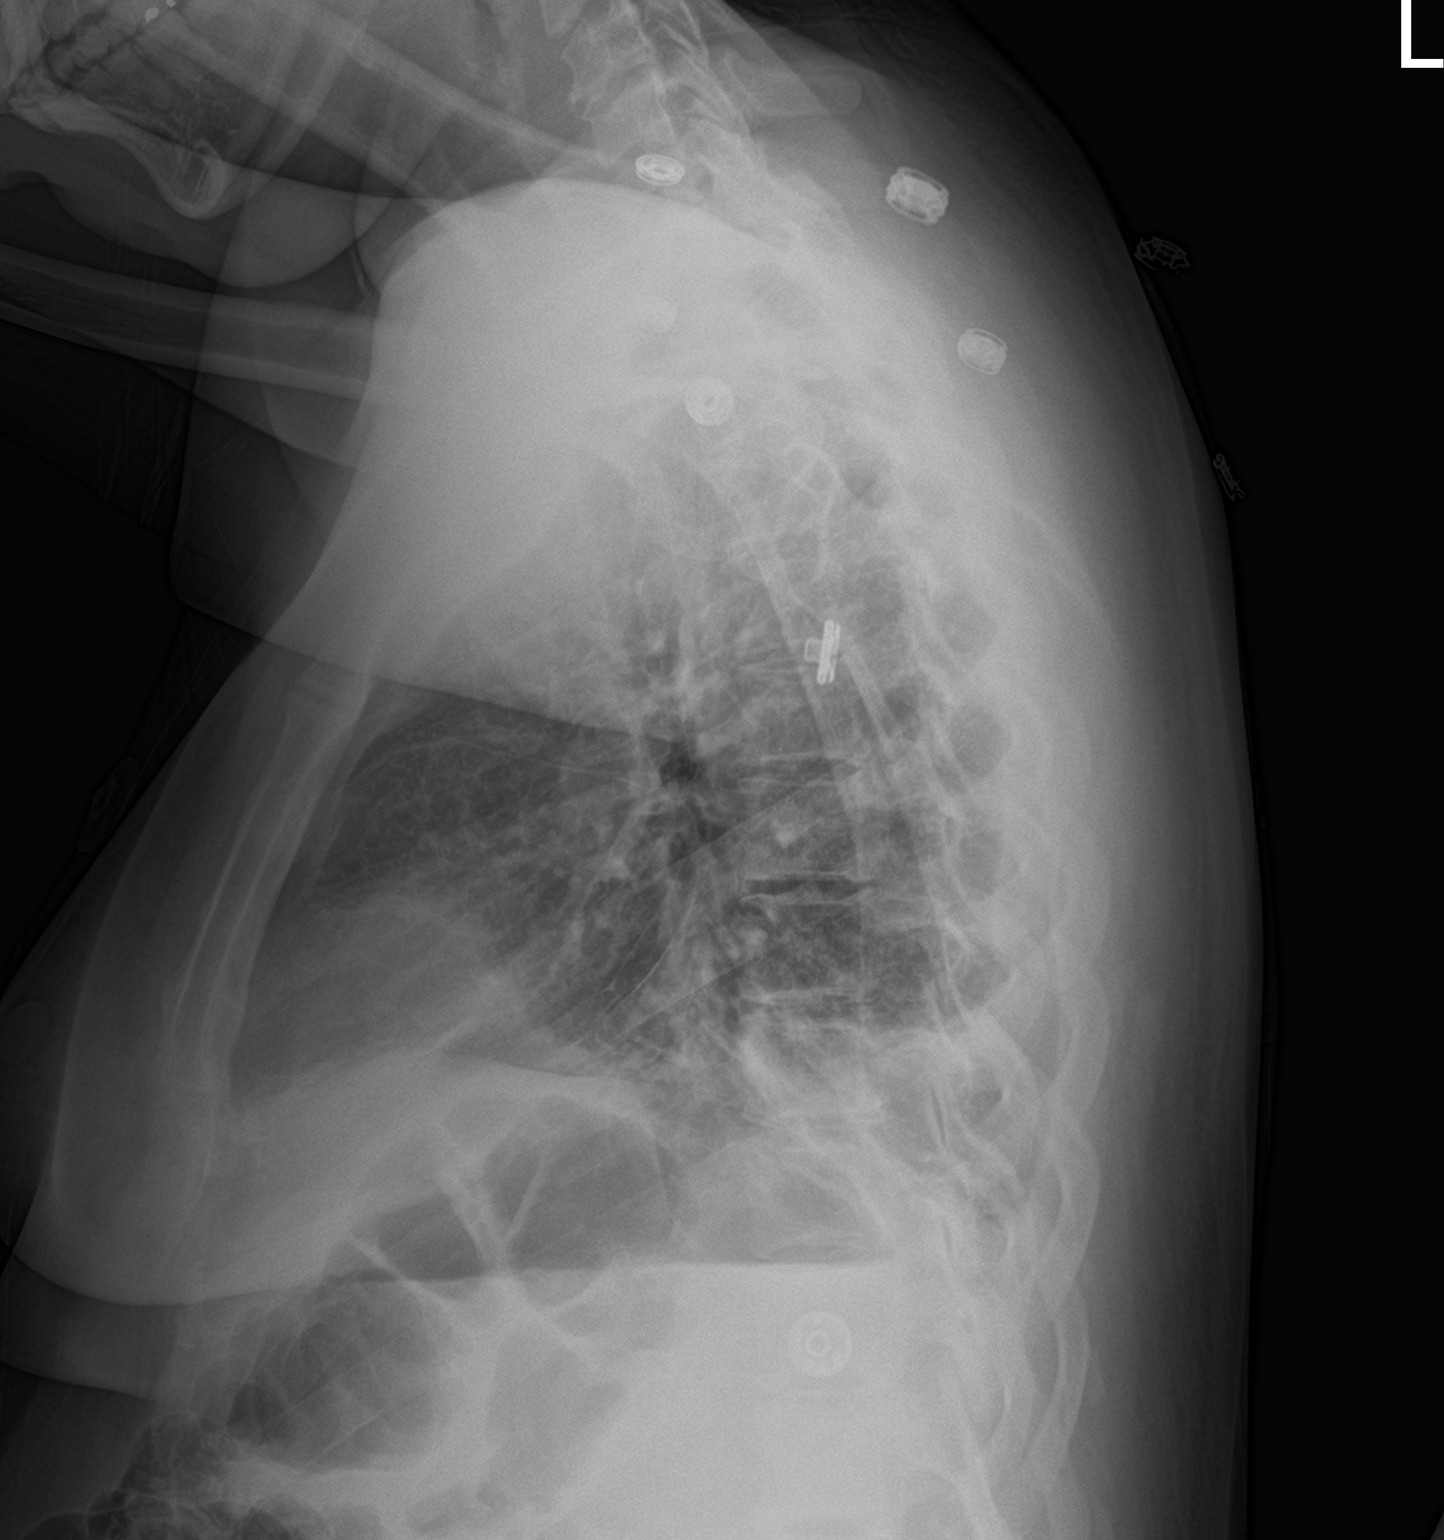
[im 2/2]
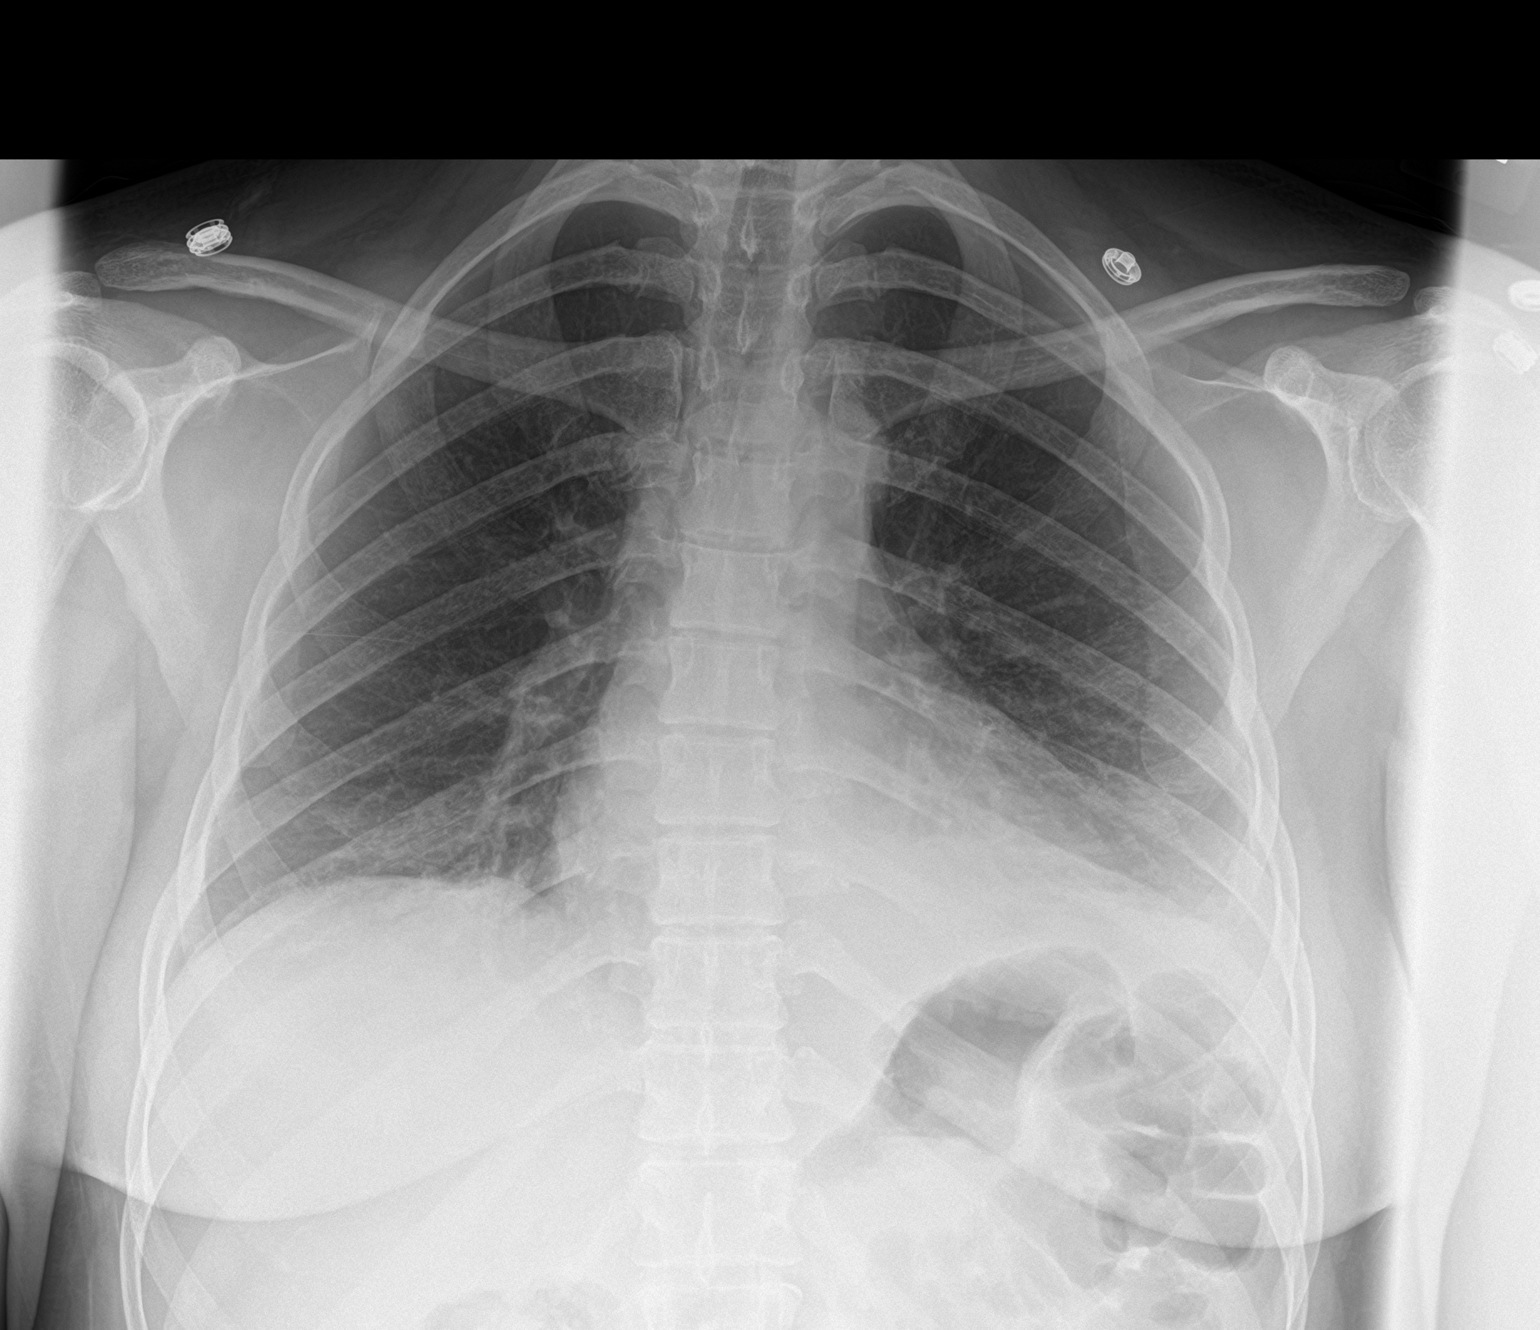

[2 of 2 positions shown; findings below may reference images not displayed]

FINDINGS: Heart size is normal. There is patchy density at the LEFT lung base
the increased compared to prior studies, consistent with effusion
and atelectasis. Early infiltrate is not excluded. New interval
patchy density at the RIGHT lung base. Findings are consistent
atelectasis or early consolidation. No evidence for pulmonary edema.
IMPRESSION: Increased bilateral LOWER lobe atelectasis or infiltrates. LEFT
pleural effusion

## 2021-12-17 ENCOUNTER — Ambulatory Visit
Admission: RE | Admit: 2021-12-17 | Discharge: 2021-12-17 | Disposition: A | Discharge status: Home / Self Care | Payer: BC Managed Care – PPO | Source: Ambulatory Visit | Attending: Urgent Care | Admitting: Urgent Care

## 2021-12-17 VITALS — BP 108/79 | HR 106 | Temp 100.0°F | Resp 18

## 2021-12-17 DIAGNOSIS — R6889 Other general symptoms and signs: Secondary | ICD-10-CM | POA: Diagnosis present

## 2021-12-17 DIAGNOSIS — U071 COVID-19: Secondary | ICD-10-CM | POA: Diagnosis not present

## 2021-12-17 LAB — RESP PANEL BY RT-PCR (FLU A&B, COVID) ARPGX2
Influenza A by PCR: NEGATIVE
Influenza B by PCR: NEGATIVE
SARS Coronavirus 2 by RT PCR: POSITIVE — AB

## 2021-12-17 NOTE — Discharge Instructions (Signed)
You have been diagnosed with a viral upper respiratory infection based on your symptoms and exam. Viral illnesses cannot be treated with antibiotics - they are self limiting - and you should find your symptoms resolving within a few days. Get plenty of rest and non-caffeinated fluids.  We have performed a respiratory swab checking for COVID, and influenza.  If the results of this testing are positive, someone will call you if you are eligible for any antiviral treatment.    We recommend you use over-the-counter medications for symptom control including Tylenol or ibuprofen for fever, chills or body aches, and cold/cough medication.  Saline mist spray is helpful for removing excess mucus from your nose.  Room humidifiers are helpful to ease breathing at night. You might also find relief of nasal/sinus congestion symptoms by using a nasal decongestant such as Sudafed sinus (pseudoephedrine).  You will need to obtain this medication from behind the pharmacist counter.  Speak to the pharmacist to verify that you are not duplicating medications with other over-the-counter formulations that you may be using.   Follow up here or with your primary care provider if your symptoms are worsening or not improving.    

## 2021-12-17 NOTE — ED Triage Notes (Signed)
Pt. Presents to UC w/ c/o a cough for the past 2 days and multiple emesis episodes that started last night.

## 2021-12-17 NOTE — ED Provider Notes (Signed)
[facility]    CSN: [id number] Arrival date & time: 12/17/21  1404      History   Chief Complaint Chief Complaint  Patient presents with   Cough    I also have been vomiting since 6:00 pm Friday - Entered by patient   Emesis    HPI Adriana Owen is a 41 y.o. female.    Cough Emesis Associated symptoms: cough     Presents to urgent care with complaint of cough x 2 days.  She reports multiple episodes of emesis that started last night.  Patient states she felt like "crud" a few days ago (Thursday) and wanted to try homeopathic treatment before taking medication or seeking treatment.  She states she took a clove of garlic on Thursday evening, another clove of garlic Friday morning and a third clove on Friday evening.  Not long after taking the third close she felt nauseous and immediately started vomiting.  She states she vomited "everything I ate for days".  She endorses undocumented fever at home along with chills and body aches.  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      Home Medications    Prior to Admission medications   Medication Sig Start Date End Date Taking? Authorizing Provider  albuterol (PROVENTIL HFA;VENTOLIN HFA) 108 (90 Base) MCG/ACT inhaler Inhale 2 puffs into the lungs every 4 (four) hours as needed for wheezing or shortness of breath. 05/07/16   Isa Rankin, MD  EPINEPHrine 0.3 mg/0.3 mL IJ SOAJ injection Inject 0.3 mg into the muscle once as needed for anaphylaxis.    [provider]  traMADol (ULTRAM) 50 MG tablet Take 1 tablet (50 mg total) by mouth every 6 (six) hours as needed. 08/05/17   Houston Siren, MD    Family History Family History   Problem Relation Age of Onset   Antithrombin III deficiency Mother     Social History Social History   Tobacco Use   Smoking status: Never   Smokeless tobacco: Never  Substance Use Topics   Alcohol use: No   Drug use: No     Allergies   Bee venom and Pork-derived products   Review of Systems Review of Systems  Respiratory:  Positive for cough.   Gastrointestinal:  Positive for vomiting.     Physical Exam Triage Vital Signs ED Triage Vitals [12/17/21 1435]  Enc Vitals Group     BP 108/79     Pulse Rate (!) 106     Resp 18     Temp 100 F (37.8 C)     Temp src      SpO2 98 %     Weight      Height      Head Circumference      Peak Flow      Pain Score 0     Pain Loc      Pain Edu?      Excl. in GC?    No data found.  Updated Vital Signs BP 108/79   Pulse (!) 106   Temp 100 F (37.8 C)   Resp 18  LMP 11/28/2021 (Exact Date)   SpO2 98%   Visual Acuity Right Eye Distance:   Left Eye Distance:   Bilateral Distance:    Right Eye Near:   Left Eye Near:    Bilateral Near:     Physical Exam Vitals reviewed.  Constitutional:      Appearance: Normal appearance.  Cardiovascular:     Rate and Rhythm: Normal rate and regular rhythm.     Pulses: Normal pulses.     Heart sounds: Normal heart sounds.  Pulmonary:     Effort: Pulmonary effort is normal.     Breath sounds: Normal breath sounds.  Abdominal:     General: Abdomen is flat. Bowel sounds are decreased.     Palpations: Abdomen is soft.     Tenderness: There is no abdominal tenderness.  Skin:    General: Skin is warm and dry.  Neurological:     General: No focal deficit present.     Mental Status: She is alert and oriented to person, place, and time.  Psychiatric:        Mood and Affect: Mood normal.        Behavior: Behavior normal.      UC Treatments / Results  Labs (all labs ordered are listed, but only abnormal results are displayed) Labs Reviewed - No data to  display  EKG   Radiology No results found.  Procedures Procedures (including critical care time)  Medications Ordered in UC Medications - No data to display  Initial Impression / Assessment and Plan / UC Course  I have reviewed the triage vital signs and the nursing notes.  Pertinent labs & imaging results that were available during my care of the patient were reviewed by me and considered in my medical decision making (see chart for details).   Patient is mildly febrile here without recent antipyretics, heart rate is elevated at 106 bpm. Satting well on room air. Overall is ill appearing, well hydrated, without respiratory distress. Pulmonary exam is unremarkable.   Suspect viral process including flu or COVID.  Results of respiratory swab are pending however patient is outside the window for antiviral treatment for influenza.  Recommending continued use of OTC medication for symptom control.  Final Clinical Impressions(s) / UC Diagnoses   Final diagnoses:  None   Discharge Instructions   None    ED Prescriptions   None    PDMP not reviewed this encounter.   Charma Igo, [place] 12/17/21 1516

## 2021-12-19 ENCOUNTER — Telehealth (HOSPITAL_COMMUNITY): Payer: Self-pay | Admitting: Emergency Medicine

## 2021-12-19 MED ORDER — NIRMATRELVIR/RITONAVIR (PAXLOVID)TABLET: 3.0000 | ORAL_TABLET | Freq: Two times a day (BID) | ORAL | 0 refills | Status: AC
